# Patient Record
Sex: Male | Born: 1995 | Race: Black or African American | Hispanic: No | Marital: Single | Smoking: Never smoker
Health system: Southern US, Community
[De-identification: ages and names within clinical notes are randomized; demographics above are authoritative.]

## PROBLEM LIST (undated history)

## (undated) DIAGNOSIS — Z789 Other specified health status: Secondary | ICD-10-CM

## (undated) HISTORY — PX: OTHER SURGICAL HISTORY: SHX169

---
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
## 2014-07-10 ENCOUNTER — Other Ambulatory Visit: Payer: Self-pay | Admitting: Infectious Disease

## 2014-07-10 ENCOUNTER — Ambulatory Visit
Admission: RE | Admit: 2014-07-10 | Discharge: 2014-07-10 | Disposition: A | Discharge status: Home / Self Care | Payer: No Typology Code available for payment source | Source: Ambulatory Visit | Attending: Infectious Disease | Admitting: Infectious Disease

## 2014-07-10 DIAGNOSIS — Z139 Encounter for screening, unspecified: Secondary | ICD-10-CM

## 2014-11-20 IMAGING — CR DG ANKLE COMPLETE 3+V*L*
3 series · 3 of 3 positions shown · non-contrast
Comparison: None.

CLINICAL DATA: Ankle pain, no injury.

EXAM:
LEFT ANKLE COMPLETE - 3+ VIEW

[t ankle joint ap left]
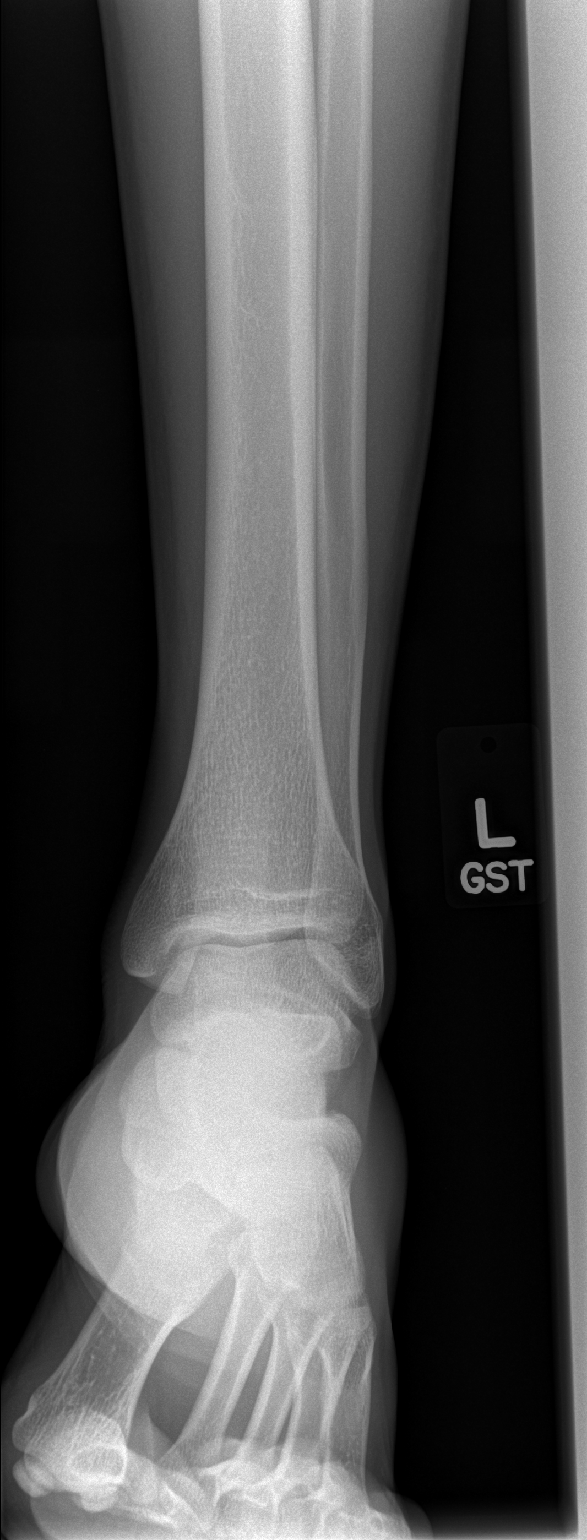

[t ankle joint oblique left]
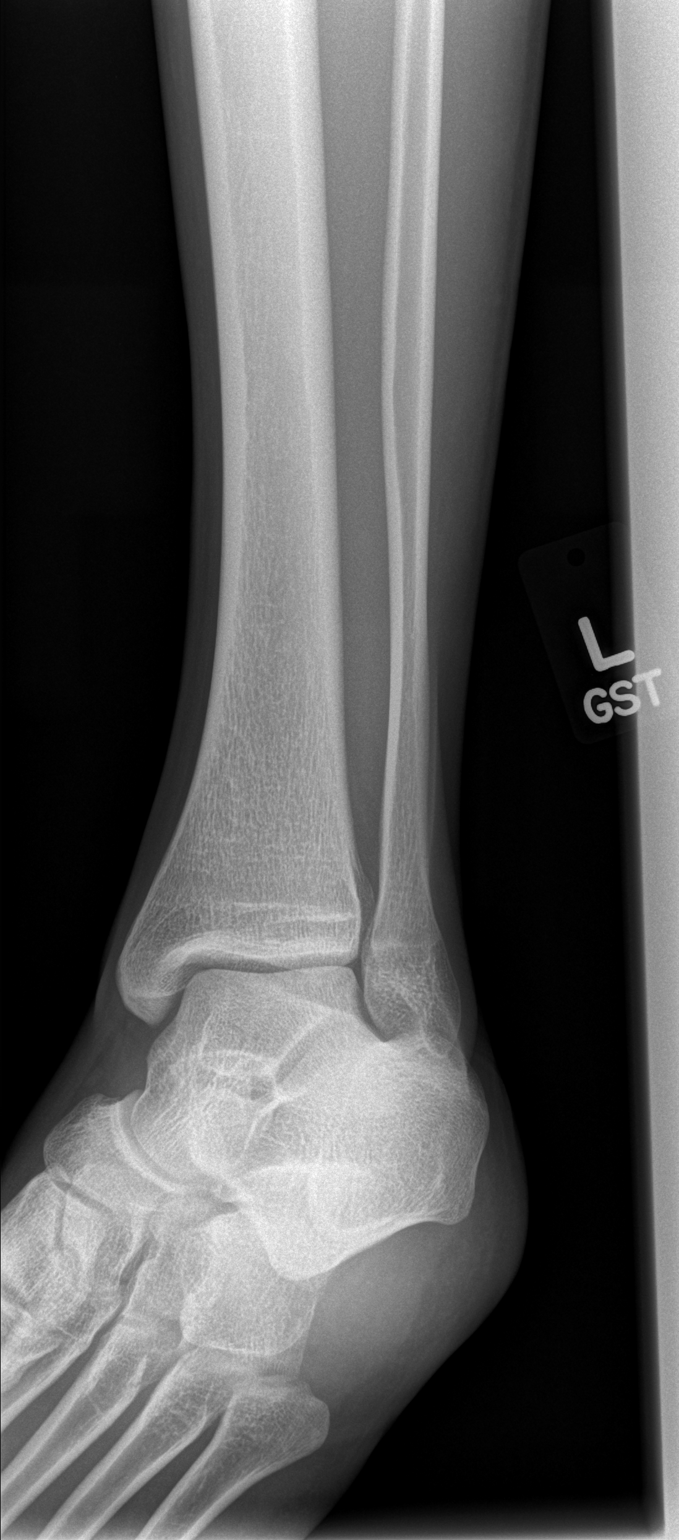

[t ankle joint lat left]
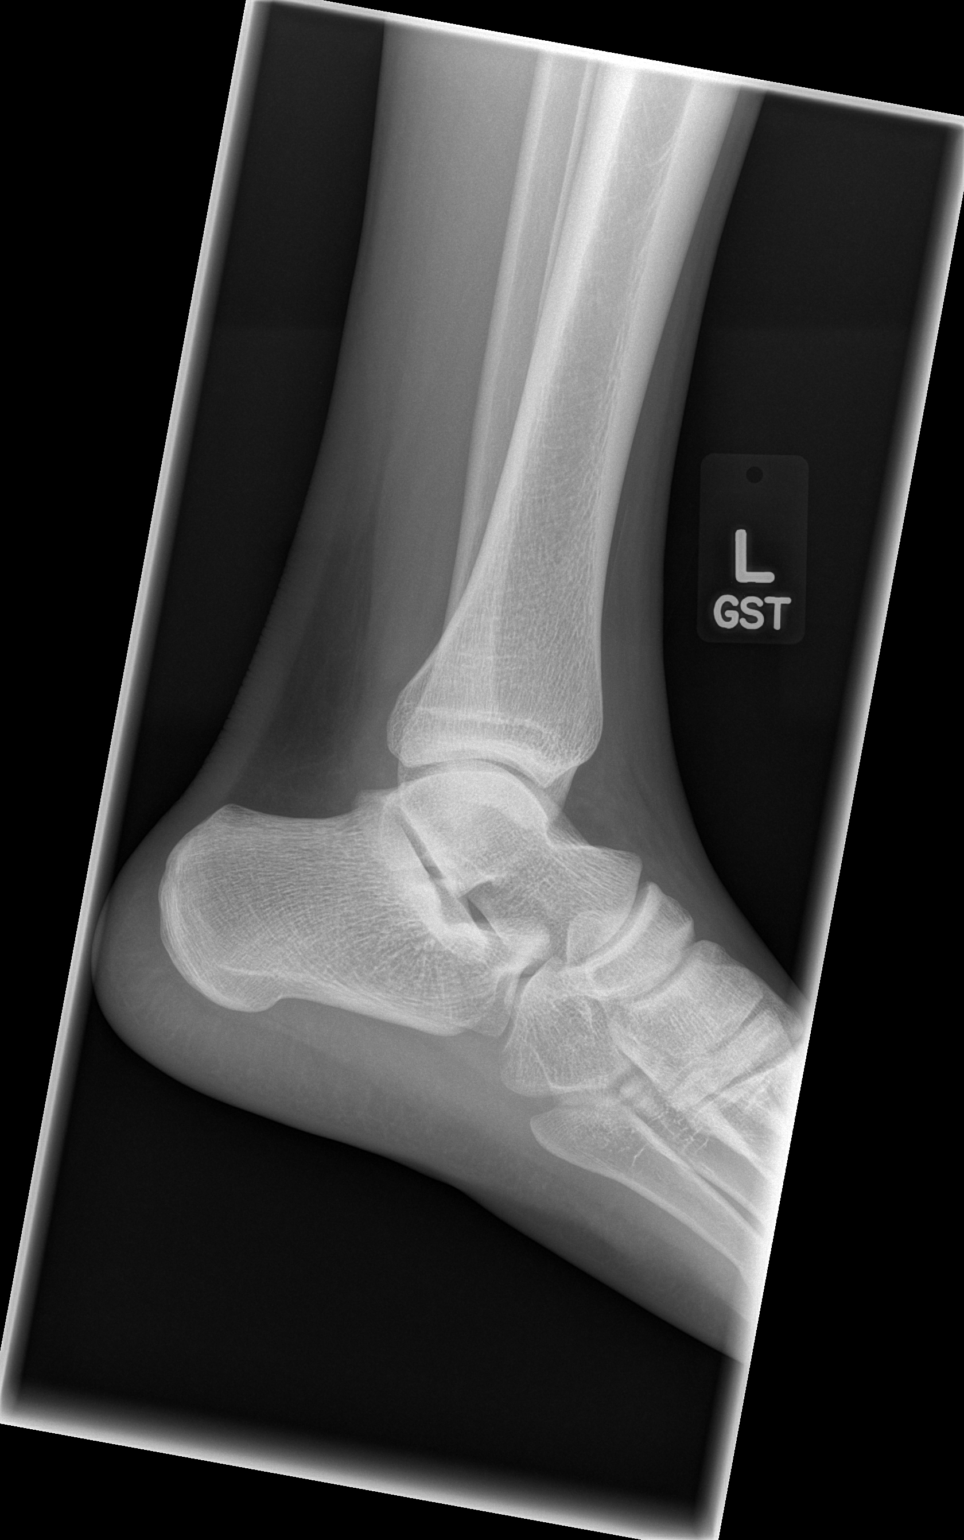

[3 of 3 positions shown; findings below may reference images not displayed]

FINDINGS: There is no evidence of fracture, dislocation, or joint effusion.
There is no evidence of arthropathy or other focal bone abnormality.
Soft tissues are unremarkable.
IMPRESSION: Negative.

  By: Purna Chandra Lunagariya

## 2023-02-28 ENCOUNTER — Ambulatory Visit
Admission: EM | Admit: 2023-02-28 | Discharge: 2023-02-28 | Disposition: A | Discharge status: Home / Self Care | Payer: Self-pay | Attending: Physician Assistant | Admitting: Physician Assistant

## 2023-02-28 DIAGNOSIS — M5442 Lumbago with sciatica, left side: Secondary | ICD-10-CM

## 2023-02-28 MED ORDER — BACLOFEN 10 MG PO TABS: 10.0000 mg | ORAL_TABLET | Freq: Three times a day (TID) | ORAL | 0 refills | Status: AC | PRN

## 2023-02-28 MED ORDER — KETOROLAC TROMETHAMINE 30 MG/ML IJ SOLN
30.0000 mg | Freq: Once | INTRAMUSCULAR | Status: AC
  Administered 2023-02-28: 30 mg via INTRAMUSCULAR

## 2023-02-28 MED ORDER — NAPROXEN 500 MG PO TABS: 500.0000 mg | ORAL_TABLET | Freq: Two times a day (BID) | ORAL | 0 refills | Status: AC

## 2023-02-28 NOTE — ED Provider Notes (Signed)
[facility]    CSN: [id number] Arrival date & time: 02/28/23  1342      History   Chief Complaint Chief Complaint  Patient presents with   Back Pain    HPI Anthony Mathews is a 27 y.o. male presenting for lower back pain, mostly on the left side with radiation down the left leg since yesterday.  Denies specific injury.  Pain worse with leaning forward and raising his left leg.  No numbness, weakness, tingling or falls.  No loss of bowel or bladder control.  Has taken 400 mg of ibuprofen daily without relief.  Pain currently 7 out of 10.  Had ibuprofen about 6 hours ago.  Patient has a history of lumbar microdiscectomy and laminectomy with decompression in 2014.  HPI  Past Medical History:  Diagnosis Date   Medical history non-contributory     There are no problems to display for this patient.   Past Surgical History:  Procedure Laterality Date   LUMBAR LAMINECTOMY/DECOMPRESSION MICRODISCECTOMY Bilateral 12/12/2012   Procedure: LUMBAR LAMINECTOMY/DECOMPRESSION MICRODISCECTOMY 1 LEVEL;  Surgeon: Carmela Hurt, MD;  Location: [facility];  Service: Neurosurgery;  Laterality: Bilateral;  Bilateral Lumbar Three-Four Laminectomy and Diskectomy   none     no previous surgeries       Home Medications    Prior to Admission medications   Medication Sig Start Date End Date Taking? Authorizing Provider  baclofen (LIORESAL) 10 MG tablet Take 1 tablet (10 mg total) by mouth 3 (three) times daily as needed for muscle spasms. 02/28/23  Yes Eusebio Friendly B, PA-C  naproxen (NAPROSYN) 500 MG tablet Take 1 tablet (500 mg total) by mouth 2 (two) times daily. 02/28/23  Yes Shirlee Latch, PA-C    Family History Family History  Problem Relation Age of Onset   Hypertension Father    Diabetes Father    Hearing loss Maternal Grandmother     Social History Social History   Tobacco Use   Smoking status: Never  Vaping Use   Vaping status: Never Used  Substance Use  Topics   Alcohol use: No   Drug use: No     Allergies   Patient has no known allergies.   Review of Systems Review of Systems  Musculoskeletal:  Positive for back pain. Negative for arthralgias.  Neurological:  Negative for weakness and numbness.     Physical Exam Triage Vital Signs ED Triage Vitals  Encounter Vitals Group     BP      Systolic BP Percentile      Diastolic BP Percentile      Pulse      Resp      Temp      Temp src      SpO2      Weight      Height      Head Circumference      Peak Flow      Pain Score      Pain Loc      Pain Education      Exclude from Growth Chart    No data found.  Updated Vital Signs BP (!) 129/93 (BP Location: Right Arm)   Pulse 62   Temp 98.1 F (36.7 C) (Oral)   Resp 17   Wt 210 lb (95.3 kg)   SpO2 97%    Physical Exam Vitals and nursing note reviewed.  Constitutional:      General: He is not in acute distress.  Appearance: Normal appearance. He is well-developed. He is not ill-appearing.  HENT:     Head: Normocephalic and atraumatic.  Eyes:     General: No scleral icterus.    Conjunctiva/sclera: Conjunctivae normal.  Cardiovascular:     Rate and Rhythm: Normal rate and regular rhythm.     Heart sounds: Normal heart sounds.  Pulmonary:     Effort: Pulmonary effort is normal. No respiratory distress.     Breath sounds: Normal breath sounds.  Musculoskeletal:     Cervical back: Neck supple.     Lumbar back: Tenderness (left paralumbar) present. No bony tenderness. Decreased range of motion. Positive left straight leg raise test. Negative right straight leg raise test.  Skin:    General: Skin is warm and dry.     Capillary Refill: Capillary refill takes less than 2 seconds.  Neurological:     General: No focal deficit present.     Mental Status: He is alert. Mental status is at baseline.     Motor: No weakness.     Gait: Gait normal.  Psychiatric:        Mood and Affect: Mood normal.        Behavior:  Behavior normal.      UC Treatments / Results  Labs (all labs ordered are listed, but only abnormal results are displayed) Labs Reviewed - No data to display  EKG   Radiology No results found.  Procedures Procedures (including critical care time)  Medications Ordered in UC Medications  ketorolac (TORADOL) 30 MG/ML injection 30 mg (has no administration in time range)    Initial Impression / Assessment and Plan / UC Course  I have reviewed the triage vital signs and the nursing notes.  Pertinent labs & imaging results that were available during my care of the patient were reviewed by me and considered in my medical decision making (see chart for details).   27 year old male presents for lower back pain, left-sided with radiation down left leg since yesterday.  Tried low-dose ibuprofen without relief.  On exam has left paravertebral tenderness, positive straight leg raise and reduced range of motion of lower back.  Lumbar strain versus disc herniation.  Patient given 30 mg IM ketorolac.  Sent naproxen and baclofen to pharmacy.  Reviewed supportive care at home.  Reviewed ED precautions.   Final Clinical Impressions(s) / UC Diagnoses   Final diagnoses:  Acute left-sided low back pain with left-sided sciatica     Discharge Instructions      BACK PAIN: Stressed avoiding painful activities . RICE (REST, ICE, COMPRESSION, ELEVATION) guidelines reviewed. May alternate ice and heat. Consider use of muscle rubs, Salonpas patches, etc. Use medications as directed including muscle relaxers if prescribed. Take anti-inflammatory medications as prescribed or OTC NSAIDs/Tylenol.  F/u with PCP in 7-10 days for reexamination, and please feel free to call or return to the urgent care at any time for any questions or concerns you may have and we will be happy to help you!   BACK PAIN RED FLAGS: If the back pain acutely worsens or there are any red flag symptoms such as numbness/tingling,  leg weakness, saddle anesthesia, or loss of bowel/bladder control, go immediately to the ER. Follow up with [place] as scheduled or sooner if the pain does not begin to resolve or if it worsens before the follow up       ED Prescriptions     Medication Sig Dispense Auth. Provider   naproxen (NAPROSYN) 500 MG tablet  Take 1 tablet (500 mg total) by mouth 2 (two) times daily. 30 tablet Eusebio Friendly B, PA-C   baclofen (LIORESAL) 10 MG tablet Take 1 tablet (10 mg total) by mouth 3 (three) times daily as needed for muscle spasms. 30 each Shirlee Latch, PA-C      I have reviewed the PDMP during this encounter.   Shirlee Latch, PA-C 02/28/23 1414

## 2023-02-28 NOTE — ED Triage Notes (Signed)
Sx started Monday. Lower back pain. More so on the left side. Radiates down his leg.

## 2023-02-28 NOTE — Discharge Instructions (Addendum)
BACK PAIN: Stressed avoiding painful activities . RICE (REST, ICE, COMPRESSION, ELEVATION) guidelines reviewed. May alternate ice and heat. Consider use of muscle rubs, Salonpas patches, etc. Use medications as directed including muscle relaxers if prescribed. Take anti-inflammatory medications as prescribed or OTC NSAIDs/Tylenol.  F/u with PCP in 7-10 days for reexamination, and please feel free to call or return to the urgent care at any time for any questions or concerns you may have and we will be happy to help you!   BACK PAIN RED FLAGS: If the back pain acutely worsens or there are any red flag symptoms such as numbness/tingling, leg weakness, saddle anesthesia, or loss of bowel/bladder control, go immediately to the ER. Follow up with us as scheduled or sooner if the pain does not begin to resolve or if it worsens before the follow up   

## 2023-03-02 ENCOUNTER — Emergency Department (HOSPITAL_COMMUNITY)
Admission: EM | Admit: 2023-03-02 | Discharge: 2023-03-02 | Disposition: A | Discharge status: Home / Self Care | Payer: Self-pay | Attending: Emergency Medicine | Admitting: Emergency Medicine

## 2023-03-02 ENCOUNTER — Other Ambulatory Visit: Payer: Self-pay

## 2023-03-02 ENCOUNTER — Encounter (HOSPITAL_COMMUNITY): Payer: Self-pay

## 2023-03-02 DIAGNOSIS — M5440 Lumbago with sciatica, unspecified side: Secondary | ICD-10-CM | POA: Insufficient documentation

## 2023-03-02 DIAGNOSIS — W540XXA Bitten by dog, initial encounter: Secondary | ICD-10-CM | POA: Insufficient documentation

## 2023-03-02 DIAGNOSIS — Z23 Encounter for immunization: Secondary | ICD-10-CM | POA: Insufficient documentation

## 2023-03-02 DIAGNOSIS — M5442 Lumbago with sciatica, left side: Secondary | ICD-10-CM

## 2023-03-02 DIAGNOSIS — S61412A Laceration without foreign body of left hand, initial encounter: Secondary | ICD-10-CM | POA: Insufficient documentation

## 2023-03-02 DIAGNOSIS — S60411A Abrasion of left index finger, initial encounter: Secondary | ICD-10-CM | POA: Insufficient documentation

## 2023-03-02 MED ORDER — AMOXICILLIN-POT CLAVULANATE 875-125 MG PO TABS: 1.0000 | ORAL_TABLET | Freq: Two times a day (BID) | ORAL | 0 refills | Status: AC

## 2023-03-02 MED ORDER — TETANUS-DIPHTH-ACELL PERTUSSIS 5-2.5-18.5 LF-MCG/0.5 IM SUSY
0.5000 mL | PREFILLED_SYRINGE | Freq: Once | INTRAMUSCULAR | Status: AC
  Administered 2023-03-02: 0.5 mL via INTRAMUSCULAR
  Filled 2023-03-02 (×2): qty 0.5

## 2023-03-02 MED ORDER — LIDOCAINE 5 % EX PTCH: 1.0000 | MEDICATED_PATCH | CUTANEOUS | 0 refills | Status: AC

## 2023-03-02 MED ORDER — PREDNISONE 10 MG (21) PO TBPK: ORAL_TABLET | Freq: Every day | ORAL | 0 refills | Status: AC

## 2023-03-02 NOTE — ED Provider Notes (Signed)
EMERGENCY DEPARTMENT AT [facility] Provider Note   CSN: [id number] Arrival date & time: 03/02/23  1633     History  Chief Complaint  Patient presents with   Sciatica   Animal Bite    Anthony Mathews is a 27 y.o. male.  27 year old male who presents with worsening lower back pain.  Seen for similar symptoms and placed on muscle relaxants.  Also sustained skin tears to his left hand from his girlfriend's dog.  No bowel or bladder dysfunction.  Pain does radiate down to his left leg.  No perineal numbness or tingling.  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       Home Medications Prior to Admission medications   Medication Sig Start Date End Date Taking? Authorizing Provider  baclofen (LIORESAL) 10 MG tablet Take 1 tablet (10 mg total) by mouth 3 (three) times daily as needed for muscle spasms. 02/28/23   Eusebio Friendly B, PA-C  naproxen (NAPROSYN) 500 MG tablet Take 1 tablet (500 mg total) by mouth 2 (two) times daily. 02/28/23   Shirlee Latch, PA-C      Allergies    Patient has no known allergies.    Review of Systems   Review of Systems  All other systems reviewed and are negative.   Physical Exam Updated Vital Signs BP (!) 145/89 (BP Location: Left Arm)   Pulse (!) 41   Temp 97.8 F (36.6 C) (Oral)   Resp 17   Wt 95.3 kg   SpO2 100%  Physical Exam Vitals and nursing note reviewed.  Constitutional:      General: He is not in acute distress.    Appearance: Normal appearance. He is well-developed. He is not toxic-appearing.  HENT:     Head: Normocephalic and atraumatic.  Eyes:     General: Lids are normal.     Conjunctiva/sclera: Conjunctivae normal.     Pupils: Pupils are equal, round, and reactive to light.  Neck:     Thyroid: No thyroid mass.     Trachea: No tracheal deviation.  Cardiovascular:     Rate and Rhythm: Normal rate and regular rhythm.     Heart sounds: Normal heart sounds. No murmur  heard.    No gallop.  Pulmonary:     Effort: Pulmonary effort is normal. No respiratory distress.     Breath sounds: Normal breath sounds. No stridor. No decreased breath sounds, wheezing, rhonchi or rales.  Abdominal:     General: There is no distension.     Palpations: Abdomen is soft.     Tenderness: There is no abdominal tenderness. There is no rebound.  Musculoskeletal:        General: No tenderness. Normal range of motion.     Cervical back: Normal range of motion and neck supple.       Back:     Comments: Abrasions noted to left index finger.  Full range of motion.  No suturable wounds appreciated.  Neurovasc intact  Skin:    General: Skin is warm and dry.     Findings: No abrasion or rash.  Neurological:     Mental Status: He is alert and oriented to person, place, and time. Mental status is at baseline.     GCS: GCS eye subscore is 4. GCS verbal subscore is 5. GCS motor subscore is 6.     Cranial Nerves: Cranial nerves are intact. No cranial nerve deficit.  Sensory: No sensory deficit.     Motor: Motor function is intact.  Psychiatric:        Attention and Perception: Attention normal.        Speech: Speech normal.        Behavior: Behavior normal.     ED Results / Procedures / Treatments   Labs (all labs ordered are listed, but only abnormal results are displayed) Labs Reviewed - No data to display  EKG None  Radiology No results found.  Procedures Procedures    Medications Ordered in ED Medications  Tdap (BOOSTRIX) injection 0.5 mL (has no administration in time range)    ED Course/ Medical Decision Making/ A&P                                 Medical Decision Making Risk Prescription drug management.   Patient's tetanus status is updated here.  Will be placed on Augmentin for the dog bite.  Will place on lidocaine patches for his low back pain as well as prednisone taper.        Final Clinical Impression(s) / ED Diagnoses Final  diagnoses:  None    Rx / DC Orders ED Discharge Orders     None         Lorre Nick, MD 03/02/23 2144

## 2023-03-02 NOTE — ED Triage Notes (Addendum)
Pt c/o sciatica pain on L side since Monday; evaluated at urgent care Tuesday, states was given a prescription for baclofen and naproxen, taking without relief ; pt also states he received dog bite to hand last night, skin tear to L ring finger, bleeding controlled; pt unsure if dog is UTD on shots
